# Patient Record
Sex: Male | Born: 1955 | Race: White | Hispanic: No | Marital: Married
Health system: Southern US, Community
[De-identification: ages and names within clinical notes are randomized; demographics above are authoritative.]

---

## 2008-01-13 ENCOUNTER — Ambulatory Visit: Payer: Self-pay | Admitting: Cardiology

## 2009-07-23 ENCOUNTER — Ambulatory Visit: Payer: Self-pay | Admitting: Cardiology

## 2010-01-20 ENCOUNTER — Inpatient Hospital Stay (HOSPITAL_COMMUNITY): Admission: EM | Admit: 2010-01-20 | Discharge: 2010-01-29 | Payer: Self-pay | Admitting: Emergency Medicine

## 2010-11-25 IMAGING — CR DG FEMUR 2V*L*
3 series · 3 of 3 positions shown · non-contrast
Comparison: None.

CLINICAL DATA: Fell.  Left hip pain.

LEFT FEMUR - 2 VIEW

[t pelvis a.p.]
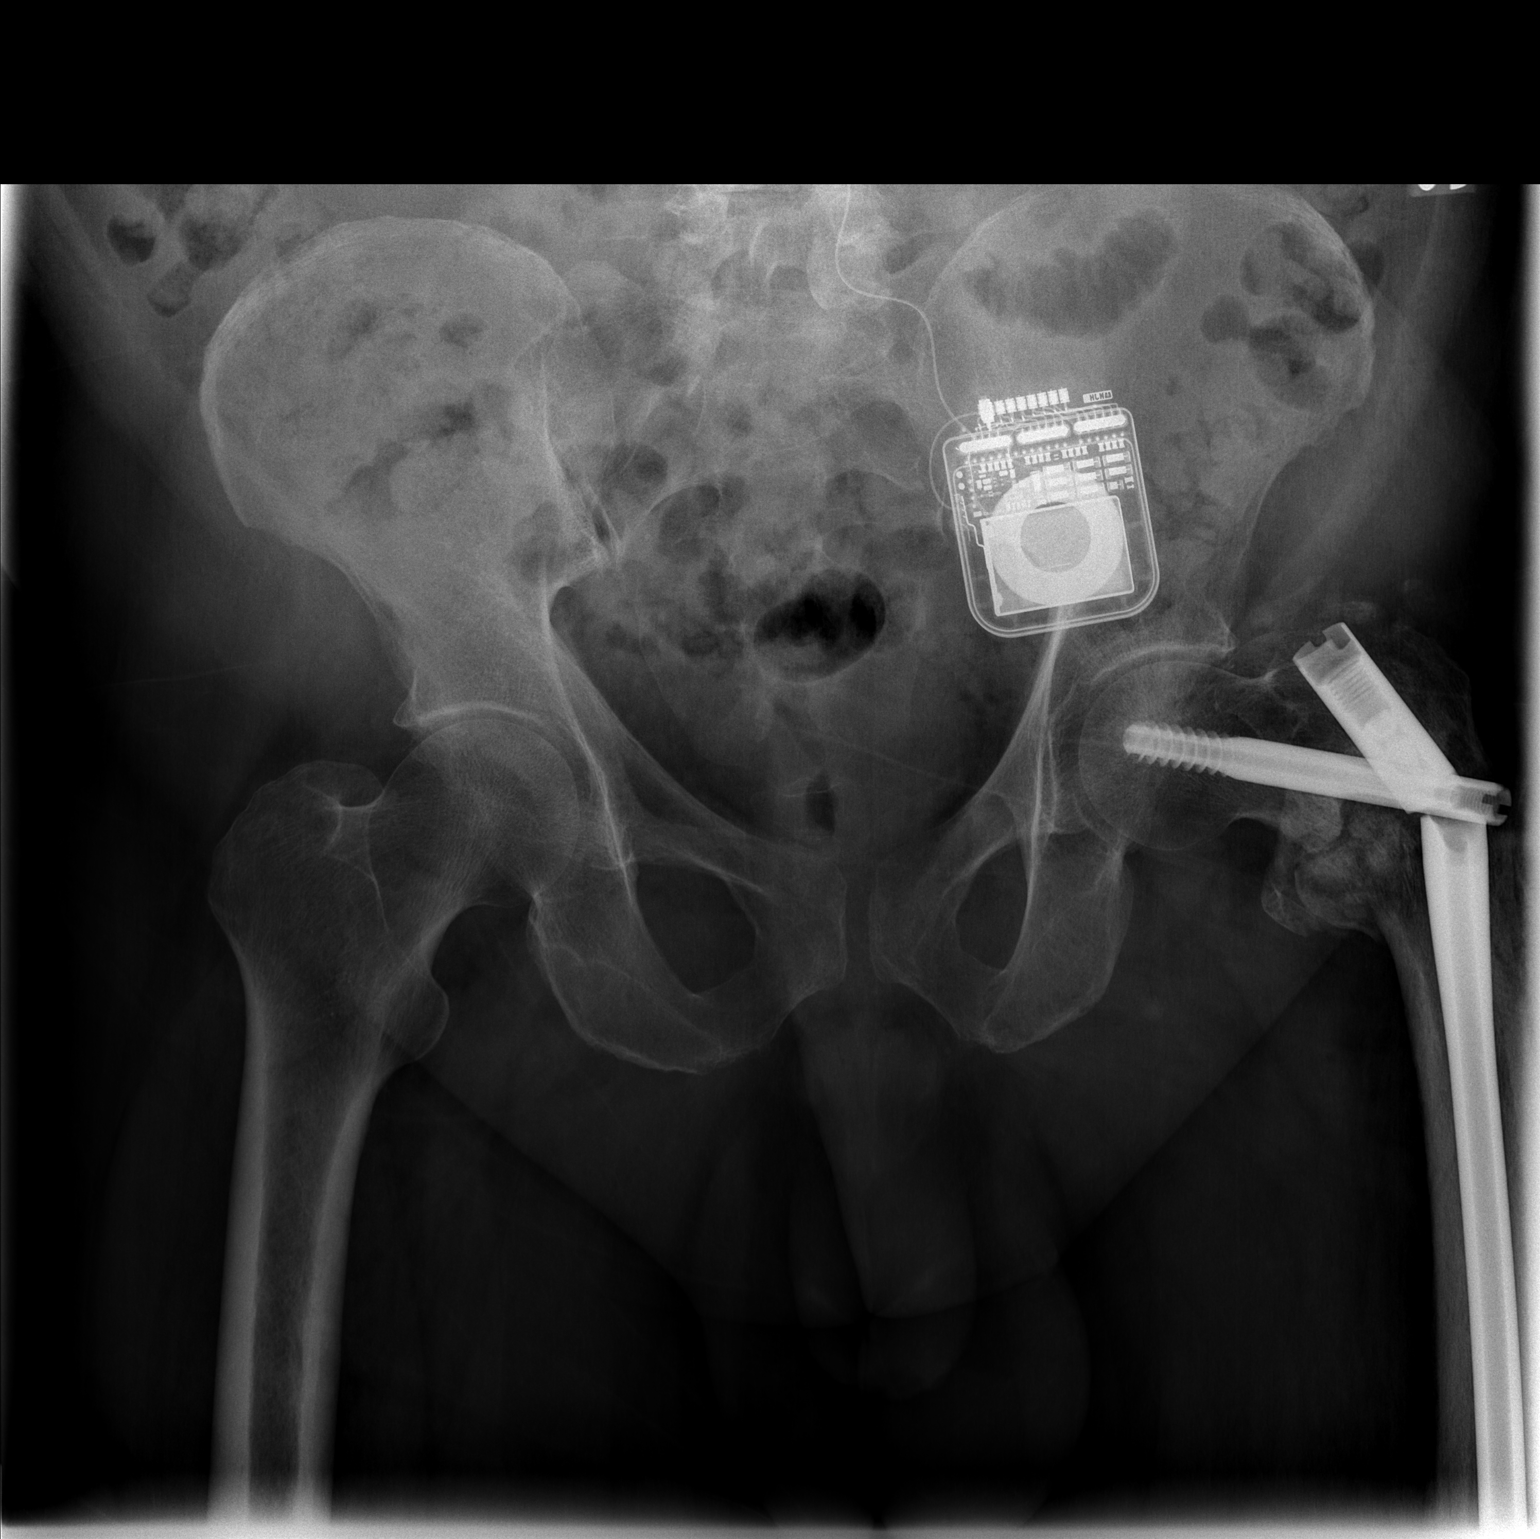

[w hip lateral left * (1 of 2)]
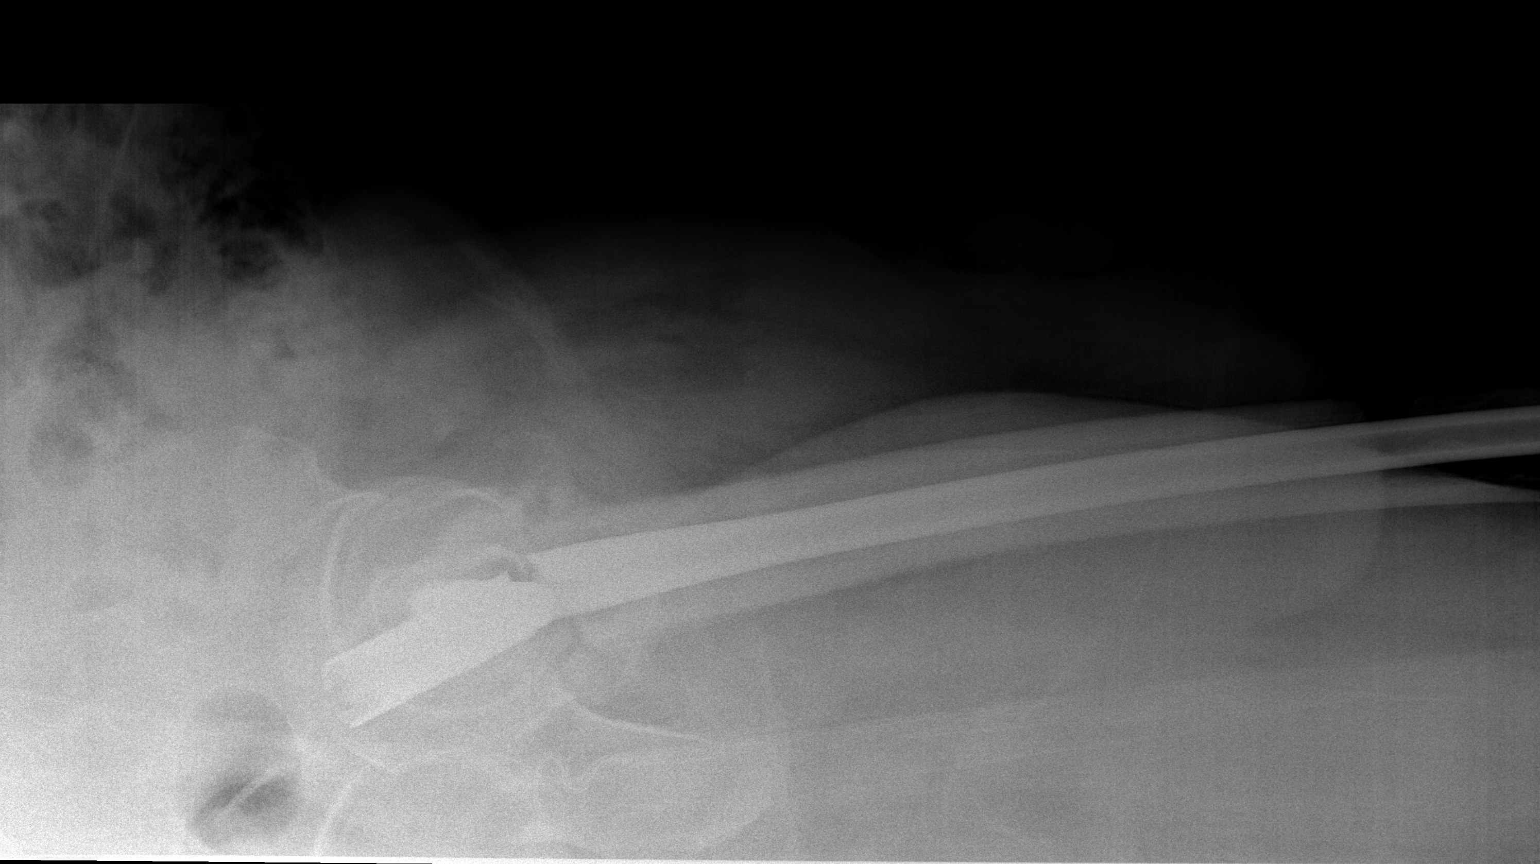

[w hip lateral left * (2 of 2)]
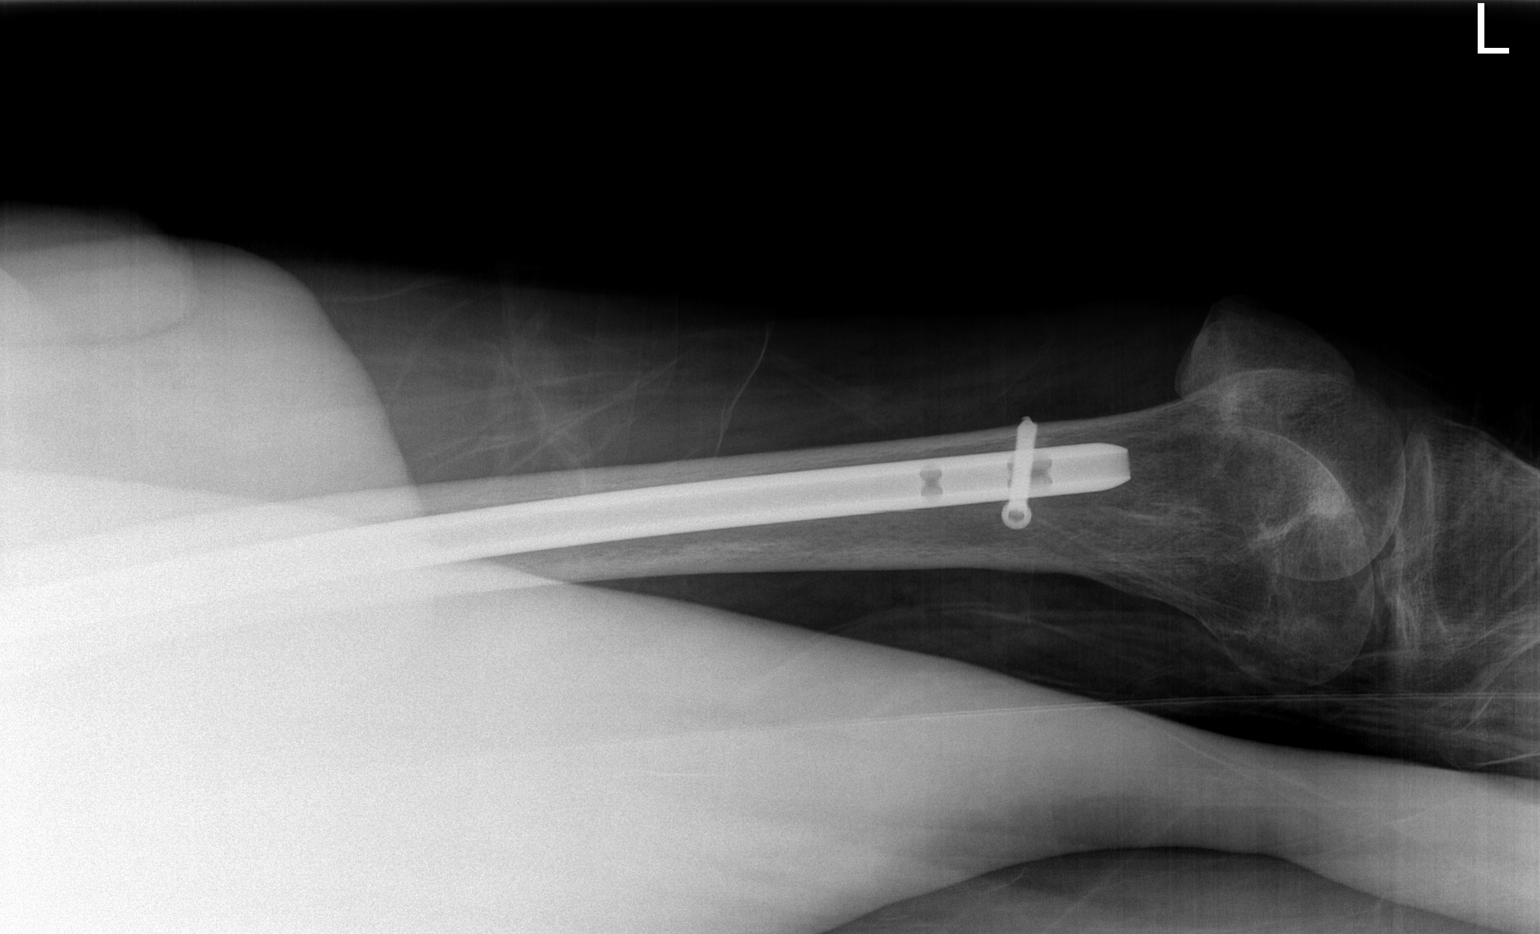

[3 of 3 positions shown; findings below may reference images not displayed]

FINDINGS: There is a fracture involving the intramedullary rod at
the compression screws site.  The intertrochanteric fracture of the
left hip is ununited.

The hips are normally located.  The bony pelvis is intact
IMPRESSION: 1.  Ununited intertrochanteric fracture of the left hip with
moderate varus deformity.
2.  Fractured intramedullary rod at the compression screws site

## 2011-02-20 LAB — BASIC METABOLIC PANEL
BUN: 11 mg/dL (ref 6–23)
BUN: 8 mg/dL (ref 6–23)
CO2: 27 mEq/L (ref 19–32)
CO2: 29 mEq/L (ref 19–32)
Calcium: 7.3 mg/dL — ABNORMAL LOW (ref 8.4–10.5)
Calcium: 7.5 mg/dL — ABNORMAL LOW (ref 8.4–10.5)
Chloride: 99 mEq/L (ref 96–112)
Creatinine, Ser: 0.9 mg/dL (ref 0.4–1.5)
Creatinine, Ser: 0.94 mg/dL (ref 0.4–1.5)
Creatinine, Ser: 1.06 mg/dL (ref 0.4–1.5)
GFR calc Af Amer: >60 mL/min (ref >60)
GFR calc non Af Amer: >60 mL/min (ref >60)
GFR calc non Af Amer: >60 mL/min (ref >60)
Glucose, Bld: 177 mg/dL — ABNORMAL HIGH (ref 70–99)
Glucose, Bld: 94 mg/dL (ref 70–99)
Potassium: 4 mEq/L (ref 3.5–5.1)
Sodium: 131 mEq/L — ABNORMAL LOW (ref 135–145)
Sodium: 132 mEq/L — ABNORMAL LOW (ref 135–145)
Sodium: 133 mEq/L — ABNORMAL LOW (ref 135–145)
Sodium: 137 mEq/L (ref 135–145)

## 2011-02-20 LAB — PROTIME-INR
INR: 0.98 (ref 0.00–1.49)
Prothrombin Time: 12.9 seconds (ref 11.6–15.2)

## 2011-02-20 LAB — CBC
Hemoglobin: 11.7 g/dL — ABNORMAL LOW (ref 13.0–17.0)
Hemoglobin: 11.8 g/dL — ABNORMAL LOW (ref 13.0–17.0)
Hemoglobin: 15.1 g/dL (ref 13.0–17.0)
MCHC: 34.7 g/dL (ref 30.0–36.0)
MCHC: 34.8 g/dL (ref 30.0–36.0)
MCV: 89.6 fL (ref 78.0–100.0)
MCV: 90.9 fL (ref 78.0–100.0)
Platelets: 219 10*3/uL (ref 150–400)
RBC: 2.92 MIL/uL — ABNORMAL LOW (ref 4.22–5.81)
RDW: 14.5 % (ref 11.5–15.5)
RDW: 14.9 % (ref 11.5–15.5)
RDW: 15.5 % (ref 11.5–15.5)
WBC: 13.6 10*3/uL — ABNORMAL HIGH (ref 4.0–10.5)
WBC: 16.9 10*3/uL — ABNORMAL HIGH (ref 4.0–10.5)
WBC: 9.6 10*3/uL (ref 4.0–10.5)

## 2011-02-20 LAB — CROSSMATCH

## 2011-02-20 LAB — ABO/RH: ABO/RH(D): A POS

## 2011-02-20 LAB — DIFFERENTIAL
Eosinophils Relative: 2 % (ref 0–5)
Lymphocytes Relative: 29 % (ref 12–46)
Monocytes Relative: 8 % (ref 3–12)
Neutro Abs: 10.2 10*3/uL — ABNORMAL HIGH (ref 1.7–7.7)

## 2011-02-20 LAB — POCT I-STAT 4, (NA,K, GLUC, HGB,HCT)
Glucose, Bld: 140 mg/dL — ABNORMAL HIGH (ref 70–99)
HCT: 30 % — ABNORMAL LOW (ref 39.0–52.0)
Hemoglobin: 10.2 g/dL — ABNORMAL LOW (ref 13.0–17.0)
Hemoglobin: 7.8 g/dL — ABNORMAL LOW (ref 13.0–17.0)
Potassium: 3.4 mEq/L — ABNORMAL LOW (ref 3.5–5.1)

## 2011-02-20 LAB — URINALYSIS, ROUTINE W REFLEX MICROSCOPIC
Hgb urine dipstick: NEGATIVE
Ketones, ur: NEGATIVE mg/dL
Nitrite: NEGATIVE
Urobilinogen, UA: 0.2 mg/dL (ref 0.0–1.0)

## 2011-02-20 LAB — HEPATIC FUNCTION PANEL
ALT: 19 U/L (ref 0–53)
AST: 25 U/L (ref 0–37)
Albumin: 3.6 g/dL (ref 3.5–5.2)
Alkaline Phosphatase: 133 U/L — ABNORMAL HIGH (ref 39–117)
Indirect Bilirubin: 0.6 mg/dL (ref 0.3–0.9)
Total Protein: 8.1 g/dL (ref 6.0–8.3)

## 2011-02-20 LAB — HEMOGLOBIN AND HEMATOCRIT, BLOOD
HCT: 25.1 % — ABNORMAL LOW (ref 39.0–52.0)
Hemoglobin: 8.1 g/dL — ABNORMAL LOW (ref 13.0–17.0)

## 2011-02-20 LAB — PREPARE RBC (CROSSMATCH)
